# Patient Record
Sex: Female | Born: 2000 | Race: White | Hispanic: Yes | Marital: Single | Smoking: Never smoker
Health system: Southern US, Community
[De-identification: ages and names within clinical notes are randomized; demographics above are authoritative.]

---
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
## 2022-04-13 ENCOUNTER — Telehealth (HOSPITAL_COMMUNITY): Payer: Self-pay

## 2022-04-13 ENCOUNTER — Ambulatory Visit (HOSPITAL_COMMUNITY)
Admission: EM | Admit: 2022-04-13 | Discharge: 2022-04-13 | Disposition: A | Discharge status: Home / Self Care | Payer: Self-pay | Attending: Physician Assistant | Admitting: Physician Assistant

## 2022-04-13 ENCOUNTER — Encounter (HOSPITAL_COMMUNITY): Payer: Self-pay

## 2022-04-13 DIAGNOSIS — J069 Acute upper respiratory infection, unspecified: Secondary | ICD-10-CM

## 2022-04-13 DIAGNOSIS — Z1152 Encounter for screening for COVID-19: Secondary | ICD-10-CM | POA: Insufficient documentation

## 2022-04-13 LAB — POC INFLUENZA A AND B ANTIGEN (URGENT CARE ONLY)
INFLUENZA A ANTIGEN, POC: NEGATIVE
INFLUENZA B ANTIGEN, POC: NEGATIVE

## 2022-04-13 MED ORDER — PROMETHAZINE-DM 6.25-15 MG/5ML PO SYRP: 5.0000 mL | ORAL_SOLUTION | Freq: Four times a day (QID) | ORAL | 0 refills | Status: AC | PRN

## 2022-04-13 MED ORDER — PROMETHAZINE-DM 6.25-15 MG/5ML PO SYRP: 5.0000 mL | ORAL_SOLUTION | Freq: Four times a day (QID) | ORAL | 0 refills | Status: DC | PRN

## 2022-04-13 NOTE — Discharge Instructions (Signed)
Take cough syrup as needed Recommend Mucinex and Flonase Will call with test results Drink plenty of fluids, rest.  Return if you develop new or worsening symptoms.

## 2022-04-13 NOTE — ED Provider Notes (Addendum)
[place]    CSN: [id number] Arrival date & time: 04/13/22  1601      History   Chief Complaint Chief Complaint  Patient presents with   Sore Throat   Chills    HPI Shelly Jimenez is a 22 y.o. female.   Patient complains of cough, chills, body aches, congestion that started about 2 days ago.  She reports her dad has been sick with similar symptoms.  He did not test for COVID or flu.  She has not taken a home COVID test.  She is taking OTC medications with minimal relief.  She denies fever, shortness of breath, wheezing, nausea, vomiting.    History reviewed. No pertinent past medical history.  There are no problems to display for this patient.   History reviewed. No pertinent surgical history.  OB History   No obstetric history on file.      Home Medications    Prior to Admission medications   Medication Sig Start Date End Date Taking? Authorizing Provider  promethazine-dextromethorphan (PROMETHAZINE-DM) 6.25-15 MG/5ML syrup Take 5 mLs by mouth 4 (four) times daily as needed for cough. 04/13/22  Yes Ward, Lenise Arena, PA-C  albuterol (VENTOLIN HFA) 108 (90 Base) MCG/ACT inhaler Inhale into the lungs.    [provider]  cetirizine (ZYRTEC) 10 MG tablet Take by mouth.    [provider]  cetirizine HCl (CETIRIZINE HCL CHILDRENS ALRGY) 5 MG/5ML SOLN Take by mouth.    [provider]    Family History History reviewed. No pertinent family history.  Social History Social History   Tobacco Use   Smoking status: Never   Smokeless tobacco: Never     Allergies   Patient has no known allergies.   Review of Systems Review of Systems  Constitutional:  Positive for chills. Negative for fever.  HENT:  Positive for congestion. Negative for ear pain and sore throat.   Eyes:  Negative for pain and visual disturbance.  Respiratory:  Positive for cough. Negative for shortness of breath.   Cardiovascular:  Negative for chest pain  and palpitations.  Gastrointestinal:  Negative for abdominal pain and vomiting.  Genitourinary:  Negative for dysuria and hematuria.  Musculoskeletal:  Negative for arthralgias and back pain.  Skin:  Negative for color change and rash.  Neurological:  Negative for seizures and syncope.  All other systems reviewed and are negative.    Physical Exam Triage Vital Signs ED Triage Vitals [04/13/22 1717]  Enc Vitals Group     BP 132/82     Pulse Rate 95     Resp 18     Temp 98.4 F (36.9 C)     Temp Source Oral     SpO2 98 %     Weight      Height      Head Circumference      Peak Flow      Pain Score      Pain Loc      Pain Edu?      Excl. in [place]?    No data found.  Updated Vital Signs BP 132/82 (BP Location: Left Arm)   Pulse 95   Temp 98.4 F (36.9 C) (Oral)   Resp 18   LMP 03/27/2022   SpO2 98%   Visual Acuity Right Eye Distance:   Left Eye Distance:   Bilateral Distance:    Right Eye Near:   Left Eye Near:    Bilateral Near:     Physical  Exam Vitals and nursing note reviewed.  Constitutional:      General: She is not in acute distress.    Appearance: She is well-developed.  HENT:     Head: Normocephalic and atraumatic.  Eyes:     Conjunctiva/sclera: Conjunctivae normal.  Cardiovascular:     Rate and Rhythm: Normal rate and regular rhythm.     Heart sounds: No murmur heard. Pulmonary:     Effort: Pulmonary effort is normal. No respiratory distress.     Breath sounds: Normal breath sounds.  Abdominal:     Palpations: Abdomen is soft.     Tenderness: There is no abdominal tenderness.  Musculoskeletal:        General: No swelling.     Cervical back: Neck supple.  Skin:    General: Skin is warm and dry.     Capillary Refill: Capillary refill takes less than 2 seconds.  Neurological:     Mental Status: She is alert.  Psychiatric:        Mood and Affect: Mood normal.      UC Treatments / Results  Labs (all labs ordered are listed, but only  abnormal results are displayed) Labs Reviewed  SARS CORONAVIRUS 2 (TAT 6-24 HRS)  POC INFLUENZA A AND B ANTIGEN (URGENT CARE ONLY)    EKG   Radiology No results found.  Procedures Procedures (including critical care time)  Medications Ordered in UC Medications - No data to display  Initial Impression / Assessment and Plan / UC Course  I have reviewed the triage vital signs and the nursing notes.  Pertinent labs & imaging results that were available during my care of the patient were reviewed by me and considered in my medical decision making (see chart for details).     URI.  Patient overall well-appearing in no acute distress COVID/flu test pending we will call with results and treat if indicated.  To care discussed.  Return precautions discussed.  Negative rapid flu.  Final Clinical Impressions(s) / UC Diagnoses   Final diagnoses:  Viral upper respiratory tract infection     Discharge Instructions      Take cough syrup as needed Recommend Mucinex and Flonase Will call with test results Drink plenty of fluids, rest.  Return if you develop new or worsening symptoms.    ED Prescriptions     Medication Sig Dispense Auth. Provider   promethazine-dextromethorphan (PROMETHAZINE-DM) 6.25-15 MG/5ML syrup Take 5 mLs by mouth 4 (four) times daily as needed for cough. 118 mL Ward, Lenise Arena, PA-C      PDMP not reviewed this encounter.   Ward, Lenise Arena, PA-C 04/13/22 1737    Ward, Lenise Arena, PA-C 04/13/22 1801

## 2022-04-13 NOTE — ED Triage Notes (Signed)
Pt reports fever, chills and a sore throat. Pt reports family members have been sick.  Pt is taking OTC medication.

## 2022-04-14 LAB — SARS CORONAVIRUS 2 (TAT 6-24 HRS): SARS Coronavirus 2: NEGATIVE
